# Patient Record
Sex: Male | Born: 1990 | Hispanic: Yes | Marital: Married | Smoking: Current some day smoker
Health system: Southern US, Community
[De-identification: ages and names within clinical notes are randomized; demographics above are authoritative.]

---

## 2020-08-03 ENCOUNTER — Other Ambulatory Visit: Payer: Self-pay | Admitting: Obstetrics and Gynecology

## 2020-08-03 ENCOUNTER — Ambulatory Visit
Admission: RE | Admit: 2020-08-03 | Discharge: 2020-08-03 | Disposition: A | Discharge status: Home / Self Care | Payer: No Typology Code available for payment source | Source: Ambulatory Visit | Attending: Obstetrics and Gynecology | Admitting: Obstetrics and Gynecology

## 2020-08-03 DIAGNOSIS — R7612 Nonspecific reaction to cell mediated immunity measurement of gamma interferon antigen response without active tuberculosis: Secondary | ICD-10-CM

## 2020-11-03 ENCOUNTER — Other Ambulatory Visit: Payer: Self-pay

## 2020-11-03 ENCOUNTER — Encounter (HOSPITAL_COMMUNITY): Payer: Self-pay

## 2020-11-03 ENCOUNTER — Ambulatory Visit (HOSPITAL_COMMUNITY)
Admission: RE | Admit: 2020-11-03 | Discharge: 2020-11-03 | Disposition: A | Discharge status: Home / Self Care | Payer: Medicaid Other | Source: Ambulatory Visit

## 2020-11-03 VITALS — BP 121/86 | HR 70 | Temp 98.4°F | Resp 16

## 2020-11-03 DIAGNOSIS — R519 Headache, unspecified: Secondary | ICD-10-CM | POA: Diagnosis not present

## 2020-11-03 NOTE — ED Provider Notes (Signed)
[facility]  ____________________________________________  Time seen: Approximately 4:54 PM  I have reviewed the triage vital signs and the nursing notes.   HISTORY  Chief Complaint appt 1500   Historian Patient     HPI George Cannon is a 30 y.o. male using a Nurse, learning disability, presents to the urgent care with concern for occipital headache that has occurred for the past 4 days after patient fell from standing height and hit concrete.  Patient states that he did experience loss of consciousness.  He denies vomiting but states that he has had some nausea.  Patient would like a CT scan of his head to rule out intracranial bleed or skull fracture.  He has been taking Tylenol at home with little relief.  He denies neck pain.  No numbness or tingling in the upper and lower extremities.   History reviewed. No pertinent past medical history.   Immunizations up to date:  Yes.     History reviewed. No pertinent past medical history.  There are no problems to display for this patient.   History reviewed. No pertinent surgical history.  Prior to Admission medications   Not on File    Allergies Patient has no known allergies.  No family history on file.  Social History     Review of Systems  Constitutional: No fever/chills Eyes:  No discharge ENT: No upper respiratory complaints. Respiratory: no cough. No SOB/ use of accessory muscles to breath Gastrointestinal:   No nausea, no vomiting.  No diarrhea.  No constipation. Musculoskeletal: Negative for musculoskeletal pain. Neuro: Patient has headache.  Skin: Negative for rash, abrasions, lacerations, ecchymosis.    ____________________________________________   PHYSICAL EXAM:  VITAL SIGNS: ED Triage Vitals  Enc Vitals Group     BP 11/03/20 1618 121/86     Pulse Rate 11/03/20 1618 70     Resp 11/03/20 1618 16     Temp 11/03/20 1618 98.4 F (36.9 C)     Temp Source 11/03/20 1618 Oral     SpO2  11/03/20 1618 97 %     Weight --      Height --      Head Circumference --      Peak Flow --      Pain Score 11/03/20 1652 0     Pain Loc --      Pain Edu? --      Excl. in GC? --      Constitutional: Alert and oriented. Well appearing and in no acute distress. Eyes: Conjunctivae are normal. PERRL. EOMI. Head: Atraumatic. ENT:      Nose: No congestion/rhinnorhea.      Mouth/Throat: Mucous membranes are moist.  Neck: No stridor.  No cervical spine tenderness to palpation. Cardiovascular: Normal rate, regular rhythm. Normal S1 and S2.  Good peripheral circulation. Respiratory: Normal respiratory effort without tachypnea or retractions. Lungs CTAB. Good air entry to the bases with no decreased or absent breath sounds Gastrointestinal: Bowel sounds x 4 quadrants. Soft and nontender to palpation. No guarding or rigidity. No distention. Musculoskeletal: Full range of motion to all extremities. No obvious deformities noted Neurologic:  Normal for age. No gross focal neurologic deficits are appreciated.  Skin:  Skin is warm, dry and intact. No rash noted. Psychiatric: Mood and affect are normal for age. Speech and behavior are normal.   ____________________________________________   LABS (all labs ordered are listed, but only abnormal results are displayed)  Labs Reviewed - No data to display ____________________________________________  EKG  ____________________________________________  RADIOLOGY   No results found.  ____________________________________________    PROCEDURES  Procedure(s) performed:     Procedures     Medications - No data to display   ____________________________________________   INITIAL IMPRESSION / ASSESSMENT AND PLAN / ED COURSE  Pertinent labs & imaging results that were available during my care of the patient were reviewed by me and considered in my medical decision making (see chart for details).      Assessment and  Plan: Headache:  30 y/o male presents to the UC with occipital headache for the past four days.  Vital signs were reassuring at triage without neuro symptoms on exam.   Patient would like head CT to rule out intracranial bleed and skull fracture. Patient was referred to the ED for further care and management due to limited diagnostic capacity here in the urgent care setting.     ____________________________________________  FINAL CLINICAL IMPRESSION(S) / ED DIAGNOSES  Final diagnoses:  Nonintractable headache, unspecified chronicity pattern, unspecified headache type      NEW MEDICATIONS STARTED DURING THIS VISIT:  ED Discharge Orders     None           This chart was dictated using voice recognition software/Dragon. Despite best efforts to proofread, errors can occur which can change the meaning. Any change was purely unintentional.     Orvil Feil, PA-C 11/03/20 1657

## 2020-11-03 NOTE — Discharge Instructions (Addendum)
Please go to Emergency Department at [facility]  Please go to emergency department for CT scan of your head. You more than likely have a concussion. However, we do not have access to CT imaging here in the urgent care and cannot rule out intracranial bleed or skull fracture given your recent occipital head trauma with loss of consciousness.

## 2020-11-03 NOTE — ED Triage Notes (Signed)
Pt was at work and felt dizzy with headache and fell down and hit head on concrete. Got patient to clarify that he fainted. Reports this happened 3 days ago. Concerned that is related to his blood pressure. Denies having any hx of blood pressure issues.  Reports only takes pain killers-tylenol extra strength for headache. Reports has been having headaches before his fall due to not sleeping well. But since syncopal episode has had worsening headaches.

## 2020-11-04 ENCOUNTER — Encounter (HOSPITAL_BASED_OUTPATIENT_CLINIC_OR_DEPARTMENT_OTHER): Payer: Self-pay

## 2020-11-04 ENCOUNTER — Emergency Department (HOSPITAL_BASED_OUTPATIENT_CLINIC_OR_DEPARTMENT_OTHER)
Admission: EM | Admit: 2020-11-04 | Discharge: 2020-11-04 | Disposition: A | Discharge status: Home / Self Care | Payer: Medicaid Other | Attending: Emergency Medicine | Admitting: Emergency Medicine

## 2020-11-04 ENCOUNTER — Other Ambulatory Visit: Payer: Self-pay

## 2020-11-04 ENCOUNTER — Emergency Department (HOSPITAL_BASED_OUTPATIENT_CLINIC_OR_DEPARTMENT_OTHER): Payer: Medicaid Other

## 2020-11-04 DIAGNOSIS — W19XXXA Unspecified fall, initial encounter: Secondary | ICD-10-CM | POA: Diagnosis not present

## 2020-11-04 DIAGNOSIS — S060X9A Concussion with loss of consciousness of unspecified duration, initial encounter: Secondary | ICD-10-CM | POA: Insufficient documentation

## 2020-11-04 DIAGNOSIS — R519 Headache, unspecified: Secondary | ICD-10-CM | POA: Diagnosis present

## 2020-11-04 DIAGNOSIS — S060XAA Concussion with loss of consciousness status unknown, initial encounter: Secondary | ICD-10-CM

## 2020-11-04 LAB — CBC WITH DIFFERENTIAL/PLATELET
Abs Immature Granulocytes: 0.01 10*3/uL (ref 0.00–0.07)
Basophils Absolute: 0 10*3/uL (ref 0.0–0.1)
Basophils Relative: 0 %
Eosinophils Absolute: 0.1 10*3/uL (ref 0.0–0.5)
Eosinophils Relative: 2 %
HCT: 42.5 % (ref 39.0–52.0)
Hemoglobin: 14.9 g/dL (ref 13.0–17.0)
Immature Granulocytes: 0 %
Lymphocytes Relative: 41 %
Lymphs Abs: 2.8 10*3/uL (ref 0.7–4.0)
MCH: 29.7 pg (ref 26.0–34.0)
MCHC: 35.1 g/dL (ref 30.0–36.0)
MCV: 84.8 fL (ref 80.0–100.0)
Monocytes Absolute: 0.7 10*3/uL (ref 0.1–1.0)
Monocytes Relative: 9 %
Neutro Abs: 3.3 10*3/uL (ref 1.7–7.7)
Neutrophils Relative %: 48 %
Platelets: 241 10*3/uL (ref 150–400)
RBC: 5.01 MIL/uL (ref 4.22–5.81)
RDW: 12.8 % (ref 11.5–15.5)
WBC: 6.9 10*3/uL (ref 4.0–10.5)
nRBC: 0 % (ref 0.0–0.2)

## 2020-11-04 LAB — BASIC METABOLIC PANEL
Anion gap: 6 (ref 5–15)
BUN: 14 mg/dL (ref 6–20)
CO2: 32 mmol/L (ref 22–32)
Calcium: 9.5 mg/dL (ref 8.9–10.3)
Chloride: 102 mmol/L (ref 98–111)
Creatinine, Ser: 0.74 mg/dL (ref 0.61–1.24)
GFR, Estimated: >60 mL/min (ref >60)
Glucose, Bld: 77 mg/dL (ref 70–99)
Potassium: 4 mmol/L (ref 3.5–5.1)
Sodium: 140 mmol/L (ref 135–145)

## 2020-11-04 MED ORDER — IBUPROFEN 400 MG PO TABS
600.0000 mg | ORAL_TABLET | Freq: Once | ORAL | Status: AC
  Administered 2020-11-04: 600 mg via ORAL
  Filled 2020-11-04: qty 1

## 2020-11-04 NOTE — ED Notes (Signed)
ED Provider at bedside. 

## 2020-11-04 NOTE — ED Triage Notes (Signed)
Interpreter [phone] George Cannon  Pt reports he fell 3-4 days ago, unable to sleep since the fall. Pt reports dizziness when he fell d/t being tired at work. States he can only sleep 2 hours before waking with a headache

## 2020-11-04 NOTE — Discharge Instructions (Addendum)
Take ibuprofen and Tylenol for your headache.  If you develop new or worsening headache, vomiting, vision changes, weakness or numbness, speech difficulty, or any other new/concerning symptoms then return to the ER for evaluation.

## 2020-11-04 NOTE — ED Provider Notes (Signed)
[facility] EMERGENCY DEPT Provider Note   CSN: [id number] Arrival date & time: 11/04/20  3557     History Chief Complaint  Patient presents with   Fall    George Cannon is a 30 y.o. male.  HPI 30 year old male presents with headache.  The Pashto video interpreter was used.  He has been dealing with a headache for 3 to 4 days since passing out and hitting the back of his head.  He was at work and felt progressively lightheaded with darkening vision for a few minutes and then fell backwards.  He states that he has had a headache ever since he woke up.  No headache prior.  No vomiting, vision changes, numbness.  He is having a little bit of neck pain at the same area as well.  Some pain with laying flat and movement of his head/neck.  He went to urgent care where they told him to get a CT scan.  He has been trying some Tylenol a couple times without relief. Headache is rated as 8/10.  History reviewed. No pertinent past medical history.  There are no problems to display for this patient.   History reviewed. No pertinent surgical history.     No family history on file.  Social History   Tobacco Use   Smoking status: Never   Smokeless tobacco: Never  Vaping Use   Vaping Use: Never used  Substance Use Topics   Alcohol use: Yes    Comment: social   Drug use: Yes    Types: Marijuana    Home Medications Prior to Admission medications   Not on File    Allergies    Patient has no known allergies.  Review of Systems   Review of Systems  Eyes:  Negative for visual disturbance.  Gastrointestinal:  Negative for vomiting.  Musculoskeletal:  Positive for neck pain.  Neurological:  Positive for syncope and headaches. Negative for weakness and numbness.  All other systems reviewed and are negative.  Physical Exam Updated Vital Signs BP 120/87   Pulse 60   Temp 98.4 F (36.9 C) (Oral)   Resp 15   SpO2 100%   Physical Exam Vitals and nursing note  reviewed.  Constitutional:      Appearance: He is well-developed.  HENT:     Head: Normocephalic and atraumatic.      Right Ear: External ear normal.     Left Ear: External ear normal.     Nose: Nose normal.  Eyes:     General:        Right eye: No discharge.        Left eye: No discharge.     Extraocular Movements: Extraocular movements intact.     Pupils: Pupils are equal, round, and reactive to light.  Cardiovascular:     Rate and Rhythm: Normal rate and regular rhythm.     Heart sounds: Normal heart sounds. No murmur heard. Pulmonary:     Effort: Pulmonary effort is normal.     Breath sounds: Normal breath sounds.  Abdominal:     Palpations: Abdomen is soft.     Tenderness: There is no abdominal tenderness.  Musculoskeletal:     Cervical back: Neck supple.  Skin:    General: Skin is warm and dry.  Neurological:     Mental Status: He is alert.     Comments: CN 3-12 grossly intact. 5/5 strength in all 4 extremities. Grossly normal sensation. Normal finger to nose.  Psychiatric:        Mood and Affect: Mood is not anxious.    ED Results / Procedures / Treatments   Labs (all labs ordered are listed, but only abnormal results are displayed) Labs Reviewed  BASIC METABOLIC PANEL  CBC WITH DIFFERENTIAL/PLATELET    EKG EKG Interpretation  Date/Time:  Wednesday November 04 2020 10:45:39 EDT Ventricular Rate:  61 PR Interval:  126 QRS Duration: 92 QT Interval:  400 QTC Calculation: 402 R Axis:   82 Text Interpretation: Normal sinus rhythm no acute ST/T changes No old tracing to compare Confirmed by Pricilla Loveless [id number]) on 11/04/2020 10:59:44 AM  Radiology CT Head Wo Contrast  Result Date: 11/04/2020 CLINICAL DATA:  Fall, head trauma 3-4 days ago EXAM: CT HEAD WITHOUT CONTRAST TECHNIQUE: Contiguous axial images were obtained from the base of the skull through the vertex without intravenous contrast. COMPARISON:  None. FINDINGS: Brain: No evidence of acute  infarction, hemorrhage, hydrocephalus, extra-axial collection or mass lesion/mass effect. Vascular: No hyperdense vessel or unexpected calcification. Skull: Normal. Negative for fracture or focal lesion. Sinuses/Orbits: No acute finding. Other: None. IMPRESSION: No acute intracranial abnormality Electronically Signed   By: Larose Hires D.O.   On: 11/04/2020 13:07   CT Cervical Spine Wo Contrast  Result Date: 11/04/2020 CLINICAL DATA:  Fall, trauma 3-4 days ago.  Neck pain EXAM: CT CERVICAL SPINE WITHOUT CONTRAST TECHNIQUE: Multidetector CT imaging of the cervical spine was performed without intravenous contrast. Multiplanar CT image reconstructions were also generated. COMPARISON:  None. FINDINGS: Alignment: Normal. Skull base and vertebrae: No acute fracture. No primary bone lesion or focal pathologic process. Soft tissues and spinal canal: No prevertebral fluid or swelling. No visible canal hematoma. Disc levels: Disc spaces are maintained. No appreciate spinal canal or neural foraminal narrowing. Upper chest: Negative. Other: None. IMPRESSION: No evidence of acute fracture or subluxation. No significant soft tissue abnormality. Electronically Signed   By: Larose Hires D.O.   On: 11/04/2020 13:13    Procedures Procedures   Medications Ordered in ED Medications  ibuprofen (ADVIL) tablet 600 mg (600 mg Oral Given 11/04/20 1154)    ED Course  I have reviewed the triage vital signs and the nursing notes.  Pertinent labs & imaging results that were available during my care of the patient were reviewed by me and considered in my medical decision making (see chart for details).    MDM Rules/Calculators/A&P                           Labs and CT results reviewed.  ECG is unremarkable.  Unclear why he had syncope several days ago.  However I have lower suspicion for serious pathology given no significant medical history, benign lab work and benign ECG.  No murmur.  Otherwise, it sound like his  headache is from a concussion and local trauma.  Discussed through the interpreter that this will take time to heal and to use ibuprofen/Tylenol.  We will give work note.  Otherwise, he appears stable for discharge home with follow-up with concussion specialist. Final Clinical Impression(s) / ED Diagnoses Final diagnoses:  Concussion with unknown loss of consciousness status, initial encounter    Rx / DC Orders ED Discharge Orders     None        Pricilla Loveless, MD 11/04/20 1525

## 2021-05-07 ENCOUNTER — Encounter: Payer: Self-pay | Admitting: Physician Assistant

## 2021-05-07 ENCOUNTER — Ambulatory Visit (INDEPENDENT_AMBULATORY_CARE_PROVIDER_SITE_OTHER): Payer: BC Managed Care – PPO | Admitting: Physician Assistant

## 2021-05-07 VITALS — BP 120/83 | HR 72 | Temp 97.2°F | Ht 68.5 in | Wt 148.0 lb

## 2021-05-07 DIAGNOSIS — Z0000 Encounter for general adult medical examination without abnormal findings: Secondary | ICD-10-CM

## 2021-05-07 DIAGNOSIS — Z789 Other specified health status: Secondary | ICD-10-CM | POA: Diagnosis not present

## 2021-05-07 DIAGNOSIS — Z13220 Encounter for screening for lipoid disorders: Secondary | ICD-10-CM

## 2021-05-07 LAB — COMPREHENSIVE METABOLIC PANEL
ALT: 18 U/L (ref 0–53)
AST: 23 U/L (ref 0–37)
Albumin: 4.9 g/dL (ref 3.5–5.2)
Alkaline Phosphatase: 83 U/L (ref 39–117)
BUN: 16 mg/dL (ref 6–23)
CO2: 32 mEq/L (ref 19–32)
Calcium: 9.7 mg/dL (ref 8.4–10.5)
Chloride: 99 mEq/L (ref 96–112)
Creatinine, Ser: 0.82 mg/dL (ref 0.40–1.50)
GFR: 118.86 mL/min (ref >60.00)
Glucose, Bld: 87 mg/dL (ref 70–99)
Potassium: 4 mEq/L (ref 3.5–5.1)
Sodium: 138 mEq/L (ref 135–145)
Total Bilirubin: 0.8 mg/dL (ref 0.2–1.2)
Total Protein: 8.1 g/dL (ref 6.0–8.3)

## 2021-05-07 LAB — LIPID PANEL
Cholesterol: 144 mg/dL (ref 0–200)
HDL: 38.5 mg/dL — ABNORMAL LOW (ref >39.00)
LDL Cholesterol: 81 mg/dL (ref 0–99)
NonHDL: 105.21
Total CHOL/HDL Ratio: 4
Triglycerides: 123 mg/dL (ref 0.0–149.0)
VLDL: 24.6 mg/dL (ref 0.0–40.0)

## 2021-05-07 LAB — CBC WITH DIFFERENTIAL/PLATELET
Basophils Absolute: 0 10*3/uL (ref 0.0–0.1)
Basophils Relative: 0.3 % (ref 0.0–3.0)
Eosinophils Absolute: 0.1 10*3/uL (ref 0.0–0.7)
Eosinophils Relative: 1.3 % (ref 0.0–5.0)
HCT: 42.8 % (ref 39.0–52.0)
Hemoglobin: 14.9 g/dL (ref 13.0–17.0)
Lymphocytes Relative: 37.7 % (ref 12.0–46.0)
Lymphs Abs: 3.4 10*3/uL (ref 0.7–4.0)
MCHC: 34.8 g/dL (ref 30.0–36.0)
MCV: 87.3 fl (ref 78.0–100.0)
Monocytes Absolute: 0.6 10*3/uL (ref 0.1–1.0)
Monocytes Relative: 7 % (ref 3.0–12.0)
Neutro Abs: 4.8 10*3/uL (ref 1.4–7.7)
Neutrophils Relative %: 53.7 % (ref 43.0–77.0)
Platelets: 267 10*3/uL (ref 150.0–400.0)
RBC: 4.9 Mil/uL (ref 4.22–5.81)
RDW: 12.1 % (ref 11.5–15.5)
WBC: 8.9 10*3/uL (ref 4.0–10.5)

## 2021-05-07 NOTE — Patient Instructions (Signed)
Good to meet you today! ?Please go to the lab for blood work and I will send results through MyChart. ?Consider quitting e-cigarette use. ?Work on lifestyle - get outside, walk, hike, enjoy the scenery in [place]. ? ?Call if any concerns.  ?

## 2021-05-07 NOTE — Progress Notes (Signed)
? ?Subjective:  ? ? Patient ID: French Dil Luci Bank, male    DOB: [birth year], 31 y.o.   MRN: [id number] ? ?Chief Complaint  ?Patient presents with  ? Establish Care  ? ?A Pashto interpreter was used for the duration of today's visit. ? ?HPI ?31 y.o. patient presents today for new patient establishment with me.  Patient has not had primary care in a long time. Last time he saw a doctor was in October 2022 after falling at work and suffering a concussion. Works during the night, sleeps during the day. Family in [place]. Lives here with friends. Moved in 2020.  ? ?Current Care Team: ?None   ? ?Acute concerns: ?No health concerns ? 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 ? ? ?History reviewed. No pertinent past medical history. ? ?History reviewed. No pertinent surgical history. ? ?History reviewed. No pertinent family history. ? ?Social History  ? ?Tobacco Use  ? Smoking status: Every Day  ?  Types: E-cigarettes  ? Smokeless tobacco: Never  ?Vaping Use  ? Vaping Use: Never used  ?Substance Use Topics  ? Alcohol use: Not Currently  ?  Comment: social  ? Drug use: Yes  ?  Types: Marijuana  ?  ? ?No Known Allergies ? ?Review of Systems ?NEGATIVE UNLESS OTHERWISE INDICATED IN HPI ? ? ?   ?Objective:  ?  ? ?Temp (!) 97.2 ?F (36.2 ?C) (Temporal)   Ht 5' 8.5" (1.74 m)   Wt 148 lb (67.1 kg)   BMI 22.17 kg/m?  ? ?Wt Readings from Last 3 Encounters:  ?05/07/21 148 lb (67.1 kg)  ? ? ?BP Readings from Last 3 Encounters:  ?11/04/20 120/87  ?11/03/20 121/86  ?  ? ?Physical Exam ?Vitals and nursing note  reviewed.  ?Constitutional:   ?   General: He is not in acute distress. ?   Appearance: Normal appearance. He is not toxic-appearing.  ?HENT:  ?   Head: Normocephalic and atraumatic.  ?   Right Ear: Tympanic membrane, ear canal and external ear normal.  ?   Left Ear: Tympanic membrane, ear canal and external ear normal.  ?   Nose: Nose normal.  ?   Mouth/Throat:  ?   Mouth: Mucous membranes are moist.  ?   Pharynx: Oropharynx is clear.  ?Eyes:  ?   Extraocular Movements: Extraocular movements intact.  ?   Conjunctiva/sclera: Conjunctivae normal.  ?   Pupils: Pupils are equal, round, and reactive to light.  ?Cardiovascular:  ?   Rate and Rhythm: Normal rate and regular rhythm.  ?   Pulses: Normal pulses.  ?   Heart sounds: Normal heart sounds.  ?Pulmonary:  ?   Effort: Pulmonary effort is normal.  ?   Breath sounds: Normal breath sounds.  ?Abdominal:  ?   General: Abdomen is flat. Bowel sounds are normal.  ?   Palpations: Abdomen is soft.  ?   Tenderness: There is no abdominal tenderness.  ?Musculoskeletal:     ?   General: Normal range of motion.  ?   Cervical back: Normal range of motion and neck supple.  ?Skin: ?  General: Skin is warm and dry.  ?Neurological:  ?   General: No focal deficit present.  ?   Mental Status: He is alert and oriented to person, place, and time.  ?Psychiatric:     ?   Mood and Affect: Mood normal.     ?   Behavior: Behavior normal.  ? ? ?   ?Assessment & Plan:  ? ?Problem List Items Addressed This Visit   ?None ?Visit Diagnoses   ? ? Encounter for annual physical exam    -  Primary  ? Relevant Orders  ? CBC with Differential/Platelet  ? Comprehensive metabolic panel  ? Lipid panel  ? Electronic cigarette use      ? Relevant Orders  ? CBC with Differential/Platelet  ? Comprehensive metabolic panel  ? Screening for cholesterol level      ? Relevant Orders  ? Lipid panel  ? ?  ? ?Plan: ?Age-appropriate screening and counseling performed today. Will check labs and call with results.  Preventive measures discussed and printed in AVS for patient.  ?Encouraged to quit e-cigarettes. ?Works a lot, encouraged healthy lifestyle and hobbies. ?No other concerns today ? ?F/up 1 year or prn.  ? ? ?Dequavion Follette M Keston Seever, PA-C ?

## 2021-06-28 ENCOUNTER — Ambulatory Visit: Payer: BC Managed Care – PPO | Admitting: Physician Assistant

## 2021-06-29 ENCOUNTER — Ambulatory Visit: Payer: BC Managed Care – PPO | Admitting: Physician Assistant

## 2021-06-29 VITALS — BP 124/80 | HR 65 | Temp 98.2°F | Ht 68.5 in | Wt 150.2 lb

## 2021-06-29 DIAGNOSIS — Z789 Other specified health status: Secondary | ICD-10-CM | POA: Diagnosis not present

## 2021-06-29 DIAGNOSIS — K219 Gastro-esophageal reflux disease without esophagitis: Secondary | ICD-10-CM

## 2021-06-29 DIAGNOSIS — R0781 Pleurodynia: Secondary | ICD-10-CM

## 2021-06-29 DIAGNOSIS — R1013 Epigastric pain: Secondary | ICD-10-CM | POA: Diagnosis not present

## 2021-06-29 LAB — COMPREHENSIVE METABOLIC PANEL
ALT: 14 U/L (ref 0–53)
AST: 18 U/L (ref 0–37)
Albumin: 4.4 g/dL (ref 3.5–5.2)
Alkaline Phosphatase: 75 U/L (ref 39–117)
BUN: 16 mg/dL (ref 6–23)
CO2: 32 mEq/L (ref 19–32)
Calcium: 9.2 mg/dL (ref 8.4–10.5)
Chloride: 101 mEq/L (ref 96–112)
Creatinine, Ser: 0.87 mg/dL (ref 0.40–1.50)
GFR: 116.64 mL/min (ref >60.00)
Glucose, Bld: 83 mg/dL (ref 70–99)
Potassium: 3.7 mEq/L (ref 3.5–5.1)
Sodium: 138 mEq/L (ref 135–145)
Total Bilirubin: 0.7 mg/dL (ref 0.2–1.2)
Total Protein: 7.5 g/dL (ref 6.0–8.3)

## 2021-06-29 LAB — CBC WITH DIFFERENTIAL/PLATELET
Basophils Absolute: 0 10*3/uL (ref 0.0–0.1)
Basophils Relative: 0.2 % (ref 0.0–3.0)
Eosinophils Absolute: 0.2 10*3/uL (ref 0.0–0.7)
Eosinophils Relative: 2.1 % (ref 0.0–5.0)
HCT: 43.9 % (ref 39.0–52.0)
Hemoglobin: 15.2 g/dL (ref 13.0–17.0)
Lymphocytes Relative: 41.2 % (ref 12.0–46.0)
Lymphs Abs: 3.1 10*3/uL (ref 0.7–4.0)
MCHC: 34.7 g/dL (ref 30.0–36.0)
MCV: 87.9 fl (ref 78.0–100.0)
Monocytes Absolute: 0.5 10*3/uL (ref 0.1–1.0)
Monocytes Relative: 6.6 % (ref 3.0–12.0)
Neutro Abs: 3.7 10*3/uL (ref 1.4–7.7)
Neutrophils Relative %: 49.9 % (ref 43.0–77.0)
Platelets: 248 10*3/uL (ref 150.0–400.0)
RBC: 4.99 Mil/uL (ref 4.22–5.81)
RDW: 12.8 % (ref 11.5–15.5)
WBC: 7.5 10*3/uL (ref 4.0–10.5)

## 2021-06-29 LAB — LIPASE: Lipase: 28 U/L (ref 11.0–59.0)

## 2021-06-29 MED ORDER — PANTOPRAZOLE SODIUM 40 MG PO TBEC: 40.0000 mg | DELAYED_RELEASE_TABLET | Freq: Every day | ORAL | 0 refills | Status: DC

## 2021-06-29 NOTE — Patient Instructions (Signed)
Thanks for coming in today!  Plan to check labs - treat pending results. Rule out Ova and parasites per stool.  Start on Protonix 40 mg once daily 30 minutes prior to first meal of day.   Consider Ultrasound abdomen.  ED if acute severe sudden worsening pain.

## 2021-06-29 NOTE — Addendum Note (Signed)
Addended by: Tharon Aquas on: 06/29/2021 09:56 AM   Modules accepted: Orders

## 2021-06-29 NOTE — Progress Notes (Signed)
Subjective:    Patient ID: George Cannon, male    DOB: December 14, 1990, 31 y.o.   MRN: [id number]  Chief Complaint  Patient presents with   Abdominal Pain    Pt c/o Upper abdominal pain and pt states having ULQ and URQ pain in abdomen as well; pt says its been going on for years but has got worse over past few days. Pt really not understanding any questions even with interpreter; sponsor came in the room to help patient with information needed to assist provider with examination    Due to language barrier, an interpreter was present during the history-taking and subsequent discussion (and for part of the physical exam) with this patient.   Abdominal Pain   Patient is a 31 yo Afghani male in today for upper abdominal pain and sore lower ribs. Worse in the last 2 days, seems somewhat better now. Able to eat. He has had this pain come and go over the last few years. Still has some pain today. Does not seem to be assoc with any foods. 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   No abnormal weight loss. No night sweats. No fevers. No stool changes. NKI.   Notes some burning/ acid feeling. Has been under a lot of stress. Irregular eating patterns.   Host with him today says that some colleagues of his were treated with Ivermectin for a type of parasite in the stomach, not sure if he was treated or not.   No past medical history on file.  No past surgical history on file.  No family history on file.  Social History   Tobacco Use   Smoking status: Every Day    Types: E-cigarettes   Smokeless tobacco: Never  Vaping Use   Vaping Use: Never used  Substance Use Topics   Alcohol use: Not Currently    Comment: social   Drug use: Yes    Types: Marijuana     No Known Allergies  Review of Systems  Gastrointestinal:  Positive for abdominal pain.   NEGATIVE UNLESS OTHERWISE INDICATED IN HPI      Objective:     BP 124/80 (BP  Location: Right Arm)   Pulse 65   Temp 98.2 F (36.8 C) (Temporal)   Ht 5' 8.5" (1.74 m)   Wt 150 lb 3.2 oz (68.1 kg)   SpO2 97%   BMI 22.51 kg/m   Wt Readings from Last 3 Encounters:  06/29/21 150 lb 3.2 oz (68.1 kg)  05/07/21 148 lb (67.1 kg)    BP Readings from Last 3 Encounters:  06/29/21 124/80  05/07/21 120/83  11/04/20 120/87     Physical Exam Vitals and nursing note reviewed.  Constitutional:      General: He is not in acute distress.    Appearance: Normal appearance. He is not toxic-appearing.  HENT:     Head: Normocephalic and atraumatic.     Right Ear: Tympanic membrane, ear canal and external ear normal.     Left Ear: Tympanic membrane, ear canal and external ear normal.     Nose: Nose normal.     Mouth/Throat:     Mouth: Mucous membranes are moist.     Pharynx: Oropharynx is clear.  Eyes:     Extraocular Movements: Extraocular movements intact.     Conjunctiva/sclera: Conjunctivae normal.     Pupils: Pupils are equal, round, and reactive to light.  Cardiovascular:  Rate and Rhythm: Normal rate and regular rhythm.     Pulses: Normal pulses.     Heart sounds: Normal heart sounds.  Pulmonary:     Effort: Pulmonary effort is normal.     Breath sounds: Normal breath sounds.  Chest:     Comments: Some TTP along bilateral lower ribs  Abdominal:     General: Abdomen is flat. Bowel sounds are normal.     Palpations: Abdomen is soft.     Tenderness: There is abdominal tenderness in the right upper quadrant, epigastric area and left upper quadrant. There is no right CVA tenderness, left CVA tenderness, guarding or rebound. Negative signs include Murphy's sign and McBurney's sign.  Musculoskeletal:        General: Normal range of motion.     Cervical back: Normal range of motion and neck supple.  Skin:    General: Skin is warm and dry.  Neurological:     General: No focal deficit present.     Mental Status: He is alert and oriented to person, place, and  time.  Psychiatric:        Mood and Affect: Mood normal.        Behavior: Behavior normal.        Assessment & Plan:   Problem List Items Addressed This Visit   None Visit Diagnoses     Epigastric pain    -  Primary   Relevant Orders   CBC with Differential/Platelet   Comprehensive metabolic panel   Lipase   H. pylori breath test   Ova and parasite examination   Gastroesophageal reflux disease without esophagitis       Relevant Medications   pantoprazole (PROTONIX) 40 MG tablet   Rib tenderness       History of foreign travel       Relevant Orders   Ova and parasite examination        Meds ordered this encounter  Medications   pantoprazole (PROTONIX) 40 MG tablet    Sig: Take 1 tablet (40 mg total) by mouth daily before breakfast.    Dispense:  30 tablet    Refill:  0    Order Specific Question:   Supervising Provider    Answer:   Shelva Majestic [[id number]]   PLAN: Check labs, treat pending results Treat for possible GERD with protonix as directed Consider abd [place] Need to r/o ova and parasites per history / colleagues with this Some soreness to ribs, probably MSK in nature due to work, treat with heat / ibuprofen prn ED precautions advised   This note was prepared with assistance of Conservation officer, historic buildings. Occasional wrong-word or sound-a-like substitutions may have occurred due to the inherent limitations of voice recognition software.    Drexel Ivey M Dhruti Ghuman, PA-C

## 2021-07-01 LAB — H. PYLORI BREATH TEST: H. pylori Breath Test: NOT DETECTED

## 2021-07-06 NOTE — Progress Notes (Signed)
Results reviewed by pt via Mychart. Seen by patient George Cannon on 07/03/2021  7:29 AM

## 2021-10-04 ENCOUNTER — Encounter: Payer: Self-pay | Admitting: *Deleted

## 2021-12-23 ENCOUNTER — Encounter: Payer: Self-pay | Admitting: *Deleted

## 2022-06-02 ENCOUNTER — Encounter: Payer: Self-pay | Admitting: Gastroenterology

## 2022-06-02 ENCOUNTER — Ambulatory Visit: Payer: 59 | Admitting: Gastroenterology

## 2022-06-02 VITALS — BP 114/80 | HR 53 | Ht 68.5 in | Wt 132.0 lb

## 2022-06-02 DIAGNOSIS — R1013 Epigastric pain: Secondary | ICD-10-CM

## 2022-06-02 DIAGNOSIS — K219 Gastro-esophageal reflux disease without esophagitis: Secondary | ICD-10-CM

## 2022-06-02 MED ORDER — PANTOPRAZOLE SODIUM 40 MG PO TBEC: 40.0000 mg | DELAYED_RELEASE_TABLET | Freq: Every day | ORAL | 3 refills | Status: DC

## 2022-06-02 NOTE — Progress Notes (Signed)
HPI:  George Cannon is a 32 y.o. male who is referred to [place] by Allwardt, Crist Infante, PA-C for further evaluation of chronic abdominal pain.  Abdominal Pain This is a chronic problem. The current episode started more than 1 month ago (patient states it started a few months ago, but patient was seen for this problem a year ago). The onset quality is undetermined. The problem occurs intermittently (patient unable to give estimation of frequency). The problem has been unchanged. The pain is located in the epigastric region. The pain is moderate. The quality of the pain is dull and aching. The abdominal pain does not radiate. Associated symptoms include anorexia, belching and weight loss. Pertinent negatives include no constipation, diarrhea, fever, hematochezia, melena, nausea or vomiting. Associated symptoms comments: 20lbs weight loss over the past year. The pain is aggravated by eating (cites milk and orange juice specifically). He has tried proton pump inhibitors (prescribed Protonix last year, which helped but symptoms returned when he ran out) for the symptoms. The treatment provided significant relief. Prior workup: negative H. pylori breath test. There is no history of abdominal surgery.   He reports being under considerable stress given his prolonged separation from his wife and children who are still in [place].  He thinks this may be contributing to his symptoms. He denies heartburn, but does admit to recurrent acid regurgitation and waterbrash. He reports having regular bowel movements and denies problems with diarrhea, constipation or blood in stool.  Component Ref Range & Units 11 mo ago  H. pylori Breath Test NOT DETECTED NOT DETECTED   History reviewed. No pertinent past medical history.   History reviewed. No pertinent surgical history. Family History  Problem Relation Age of Onset   Hypertension Father    Colon cancer Neg Hx    Esophageal cancer Neg Hx    Stomach  cancer Neg Hx    Social History   Tobacco Use   Smoking status: Every Day    Types: E-cigarettes   Smokeless tobacco: Never  Vaping Use   Vaping Use: Never used  Substance Use Topics   Alcohol use: Not Currently    Comment: social   Drug use: Yes    Types: Marijuana   Current Outpatient Medications  Medication Sig Dispense Refill   acetaminophen (TYLENOL) 500 MG tablet Take 500 mg by mouth every 6 (six) hours as needed.     pantoprazole (PROTONIX) 40 MG tablet Take 1 tablet (40 mg total) by mouth daily before breakfast. 30 tablet 0   No current facility-administered medications for this visit.   No Known Allergies   Review of Systems  Constitutional:  Positive for weight loss. Negative for chills and fever.  Gastrointestinal:  Positive for abdominal pain and anorexia. Negative for blood in stool, constipation, diarrhea, heartburn, hematochezia, melena, nausea and vomiting.      No results found.  BP 114/80   Pulse (!) 53   Ht 5' 8.5" (1.74 m)   Wt 132 lb (59.9 kg)   BMI 19.78 kg/m  Physical Exam Vitals and nursing note (He is accompanied by a medical translator today) reviewed.  Constitutional:      General: He is not in acute distress.    Appearance: Normal appearance. He is normal weight. He is not ill-appearing.  HENT:     Head: Normocephalic and atraumatic.     Mouth/Throat:     Mouth: Mucous membranes are moist.     Pharynx: Oropharynx is clear. No oropharyngeal exudate  or posterior oropharyngeal erythema.  Eyes:     General: No scleral icterus.    Conjunctiva/sclera: Conjunctivae normal.  Cardiovascular:     Rate and Rhythm: Normal rate and regular rhythm.     Heart sounds: Normal heart sounds. No murmur heard. Pulmonary:     Effort: Pulmonary effort is normal.     Breath sounds: Normal breath sounds. No wheezing, rhonchi or rales.  Abdominal:     General: Abdomen is flat. Bowel sounds are normal. There is no distension.     Palpations: Abdomen is  soft. There is no mass.     Tenderness: There is abdominal tenderness. There is no guarding or rebound.  Musculoskeletal:     Cervical back: Normal range of motion and neck supple.  Skin:    General: Skin is warm and dry.     Coloration: Skin is not jaundiced.     Findings: No lesion or rash.  Neurological:     Mental Status: He is alert and oriented to person, place, and time.  Psychiatric:        Mood and Affect: Mood normal.        Behavior: Behavior normal.      CBC    Component Value Date/Time   WBC 7.5 06/29/2021 0947   RBC 4.99 06/29/2021 0947   HGB 15.2 06/29/2021 0947   HCT 43.9 06/29/2021 0947   PLT 248.0 06/29/2021 0947   MCV 87.9 06/29/2021 0947   MCH 29.7 11/04/2020 1137   MCHC 34.7 06/29/2021 0947   RDW 12.8 06/29/2021 0947   LYMPHSABS 3.1 06/29/2021 0947   MONOABS 0.5 06/29/2021 0947   EOSABS 0.2 06/29/2021 0947   BASOSABS 0.0 06/29/2021 0947    CMP     Component Value Date/Time   NA 138 06/29/2021 0947   K 3.7 06/29/2021 0947   CL 101 06/29/2021 0947   CO2 32 06/29/2021 0947   GLUCOSE 83 06/29/2021 0947   BUN 16 06/29/2021 0947   CREATININE 0.87 06/29/2021 0947   CALCIUM 9.2 06/29/2021 0947   PROT 7.5 06/29/2021 0947   ALBUMIN 4.4 06/29/2021 0947   AST 18 06/29/2021 0947   ALT 14 06/29/2021 0947   ALKPHOS 75 06/29/2021 0947   BILITOT 0.7 06/29/2021 0947   GFRNONAA >60 11/04/2020 1137    ASSESSMENT AND PLAN: 32 year old male with at least a 1 year history of chronic upper abdominal pain with associated decrease in appetite and nearly 20lbs of unintentional weight loss over the past year.  Accurate history is still difficult despite presence of a translator, but it appears he had significant improvement in his symptoms when he was taking pantoprazole, but he ran out and did not request a refill.  He does have associated symptoms of acid regurgitation and waterbrash, which suggests GERD as the underlying problem.   The unintentional weight loss is  certainly concerning, and I recommended an upper endoscopy to exclude serious etiologies such as malignancy or PUD.  I also agreed with testing for parasitic infections, as had previously been ordered (stool sample not submitted).  The patient declined an upper endoscopy and a stool test, but preferred to go back on the on the pantoprazole, and then reconsider those options if his symptoms do not improve.  This is reasonable.  We discussed how stress and anxiety can cause many GI symptoms and can lead to decreased appetite and weight loss and that he should consider talking with his PCP or behavioral health to help manage  and cope with his feelings regarding his prolonged separation from his family.  Epigastric pain with associated weight loss, suspect GERD/anxiety - Resume Protonix daily - EGD, stool testing recommended but declined - f/u 8 weeks; will reconsider further testing at that time if symptoms not improved with PPI  I spent a total of 45 minutes reviewing the patient's medical record, interviewing and examining the patient, discussing his diagnosis and management of her condition going forward, and documenting in the medical record   Problem List Items Addressed This Visit   None Visit Diagnoses     Gastroesophageal reflux disease, unspecified whether esophagitis present    -  Primary   Relevant Medications   pantoprazole (PROTONIX) 40 MG tablet   Abdominal pain, epigastric          Luba Matzen E. Tomasa Rand, MD [facility]   CC:  Allwardt, Crist Infante, PA-C

## 2022-06-02 NOTE — Patient Instructions (Signed)
_______________________________________________________  If your blood pressure at your visit was 140/90 or greater, please contact your primary care physician to follow up on this.  _______________________________________________________  If you are age 30 or older, your body mass index should be between 23-30. Your Body mass index is 19.78 kg/m. If this is out of the aforementioned range listed, please consider follow up with your Primary Care Provider.  If you are age 3 or younger, your body mass index should be between 19-25. Your Body mass index is 19.78 kg/m. If this is out of the aformentioned range listed, please consider follow up with your Primary Care Provider.   We have sent the following medications to your pharmacy for you to pick up at your convenience: Pantoprazole 40 mg daily.  ________________________________________________________  The [facility] providers would like to encourage you to use [facility] to communicate with providers for non-urgent requests or questions.  Due to long hold times on the telephone, sending your provider a message by [facility] may be a faster and more efficient way to get a response.  Please allow 48 business hours for a response.  Please remember that this is for non-urgent requests.   It was a pleasure to see you today!  Thank you for trusting me with your gastrointestinal care!    Scott E.Tomasa Rand, MD

## 2022-06-03 ENCOUNTER — Telehealth: Payer: Self-pay | Admitting: Pharmacy Technician

## 2022-06-03 ENCOUNTER — Other Ambulatory Visit (HOSPITAL_COMMUNITY): Payer: Self-pay

## 2022-06-03 NOTE — Telephone Encounter (Signed)
Patient Advocate Encounter  Received notification from [facility] that prior authorization for PANTOPRAZOLE 40MG  is required.   PA NOT SUBMITTED   This medication can be filled with [facility] pharmacy locations at a price of $5 for 30 days without being billed to insurance. Patient is also welcome to check with retail locations for discounted prices.

## 2022-06-08 ENCOUNTER — Other Ambulatory Visit (HOSPITAL_COMMUNITY): Payer: Self-pay

## 2022-06-08 ENCOUNTER — Other Ambulatory Visit: Payer: Self-pay

## 2022-06-08 MED ORDER — PANTOPRAZOLE SODIUM 40 MG PO TBEC
40.0000 mg | DELAYED_RELEASE_TABLET | Freq: Every day | ORAL | 3 refills | Status: AC
  Filled 2022-06-08: qty 30, 30d supply, fill #0

## 2022-06-08 NOTE — Telephone Encounter (Signed)
Contacted patient and informed him that his insurance would not cover pantoprazole but could be sent to a [facility] pharmacy and he would pay $5 for thirty days. Patient agreed. Pantoprazole 40 mg was sent to [facility].

## 2022-06-16 ENCOUNTER — Other Ambulatory Visit (HOSPITAL_COMMUNITY): Payer: Self-pay

## 2022-08-05 ENCOUNTER — Ambulatory Visit: Payer: BC Managed Care – PPO | Admitting: Internal Medicine

## 2022-08-30 ENCOUNTER — Ambulatory Visit: Payer: 59 | Admitting: Gastroenterology

## 2022-10-24 ENCOUNTER — Ambulatory Visit (HOSPITAL_COMMUNITY)
Admission: RE | Admit: 2022-10-24 | Discharge: 2022-10-24 | Disposition: A | Discharge status: Home / Self Care | Payer: Medicaid Other | Source: Ambulatory Visit | Attending: Family Medicine | Admitting: Family Medicine

## 2022-10-24 ENCOUNTER — Encounter (HOSPITAL_COMMUNITY): Payer: Self-pay

## 2022-10-24 VITALS — BP 125/82 | HR 70 | Temp 98.3°F | Resp 16

## 2022-10-24 DIAGNOSIS — K122 Cellulitis and abscess of mouth: Secondary | ICD-10-CM | POA: Diagnosis not present

## 2022-10-24 MED ORDER — AMOXICILLIN 500 MG PO CAPS: 500.0000 mg | ORAL_CAPSULE | Freq: Three times a day (TID) | ORAL | 0 refills | Status: AC

## 2022-10-24 NOTE — ED Provider Notes (Signed)
[facility]    CSN: [id number] Arrival date & time: 10/24/22  0849      History   Chief Complaint Chief Complaint  Patient presents with   Oral Swelling    Pashto speaker. Possible infection. - Entered by patient    HPI George Cannon is a 32 y.o. male.   Right patient is presenting with lower molar pain.  Patient does have a history of crown placement over that side.  Patient has some tenderness on that side and feels like is not getting any better.  Patient is wondering if we can replace his counts today but patient otherwise evidently did not do that here and that he will likely need to see a dentist.  Patient denies any fevers or chills.  No other concerns at this time.     History reviewed. No pertinent past medical history.  There are no problems to display for this patient.   History reviewed. No pertinent surgical history.     Home Medications    Prior to Admission medications   Medication Sig Start Date End Date Taking? Authorizing Provider  amoxicillin (AMOXIL) 500 MG capsule Take 1 capsule (500 mg total) by mouth 3 (three) times daily. 10/24/22  Yes Brenton Grills, MD  acetaminophen (TYLENOL) 500 MG tablet Take 500 mg by mouth every 6 (six) hours as needed.    [provider]  pantoprazole (PROTONIX) 40 MG tablet Take 1 tablet (40 mg total) by mouth daily. 06/08/22 10/06/22  Jenel Lucks, MD    Family History Family History  Problem Relation Age of Onset   Hypertension Father    Colon cancer Neg Hx    Esophageal cancer Neg Hx    Stomach cancer Neg Hx     Social History Social History   Tobacco Use   Smoking status: Never   Smokeless tobacco: Never   Tobacco comments:    Snuff  Vaping Use   Vaping status: Never Used  Substance Use Topics   Alcohol use: Not Currently    Comment: social   Drug use: Not Currently    Types: Marijuana     Allergies   Patient has no known allergies.   Review of Systems Review of  Systems   Physical Exam Triage Vital Signs ED Triage Vitals  Encounter Vitals Group     BP 10/24/22 0914 125/82     Systolic BP Percentile --      Diastolic BP Percentile --      Pulse Rate 10/24/22 0914 70     Resp 10/24/22 0914 16     Temp 10/24/22 0914 98.3 F (36.8 C)     Temp Source 10/24/22 0914 Oral     SpO2 10/24/22 0914 97 %     Weight --      Height --      Head Circumference --      Peak Flow --      Pain Score 10/24/22 0912 2     Pain Loc --      Pain Education --      Exclude from Growth Chart --    No data found.  Updated Vital Signs BP 125/82 (BP Location: Left Arm)   Pulse 70   Temp 98.3 F (36.8 C) (Oral)   Resp 16   SpO2 97%   Visual Acuity Right Eye Distance:   Left Eye Distance:   Bilateral Distance:    Right Eye Near:   Left Eye Near:  Bilateral Near:     Physical Exam Constitutional:      Appearance: Normal appearance.  Cardiovascular:     Rate and Rhythm: Normal rate and regular rhythm.     Pulses: Normal pulses.     Heart sounds: Normal heart sounds.  Neurological:     Mental Status: He is alert.      UC Treatments / Results  Labs (all labs ordered are listed, but only abnormal results are displayed) Labs Reviewed - No data to display  EKG   Radiology No results found.  Procedures Procedures (including critical care time)  Medications Ordered in UC Medications - No data to display  Initial Impression / Assessment and Plan / UC Course  I have reviewed the triage vital signs and the nursing notes.  Pertinent labs & imaging results that were available during my care of the patient were reviewed by me and considered in my medical decision making (see chart for details).     Patient likely dealing with some swelling/infection of the gums of the right lower teeth.  Patient does have noted poor dental hygiene, at this time we will do amoxicillin 3 pills a day for the next 7 days.  Patient was also given resources  regarding local secondary dental areas that he may present in order to get his crown replaced. Final Clinical Impressions(s) / UC Diagnoses   Final diagnoses:  Oral infection     Discharge Instructions      Please take your antibiotics  Please go see a dentist that is on the sheet that is given to you.  Please take Tylenol and ibuprofen for any pain that you may have.    ED Prescriptions     Medication Sig Dispense Auth. Provider   amoxicillin (AMOXIL) 500 MG capsule Take 1 capsule (500 mg total) by mouth 3 (three) times daily. 21 capsule Brenton Grills, MD      PDMP not reviewed this encounter.   Brenton Grills, MD 10/24/22 [id number]

## 2022-10-24 NOTE — Discharge Instructions (Addendum)
Please take your antibiotics  Please go see a dentist that is on the sheet that is given to you.  Please take Tylenol and ibuprofen for any pain that you may have.

## 2022-10-24 NOTE — ED Triage Notes (Signed)
Patient here today with c/o right lower dental pain and swelling X 1 week. Patient has a cap that he has been using from [place] but the cap has became worn and broken. He is having pain when he chews.

## 2023-05-12 IMAGING — CT CT HEAD W/O CM
4 series · 17 of 47 positions shown, 19 images · non-contrast
Comparison: None.

CLINICAL DATA: Fall, head trauma 3-4 days ago

EXAM:
CT HEAD WITHOUT CONTRAST
TECHNIQUE: Contiguous axial images were obtained from the base of the skull
through the vertex without intravenous contrast.

[Series 2: head wo · axial · 0.41mm/px · z∈[+3,+118]mm · 7 of 31 slices shown, 9 images]
[im 4/31  brain]
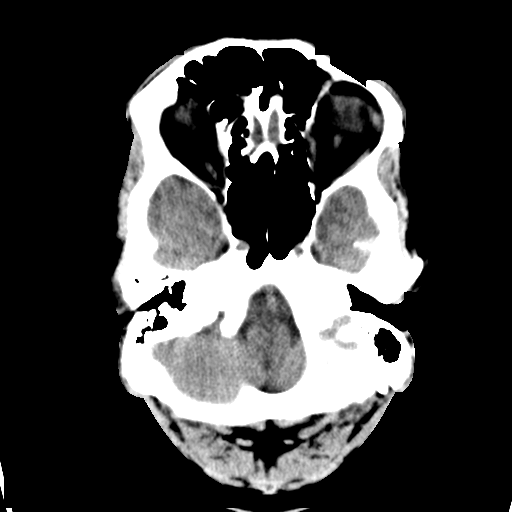
[im 4/31  bone]
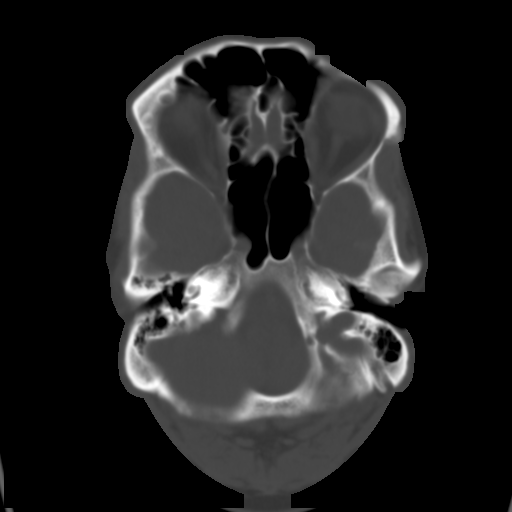
[im 8/31  brain]
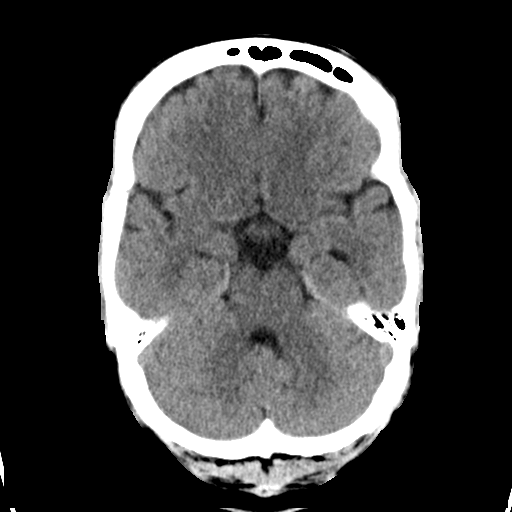
[im 12/31  brain]
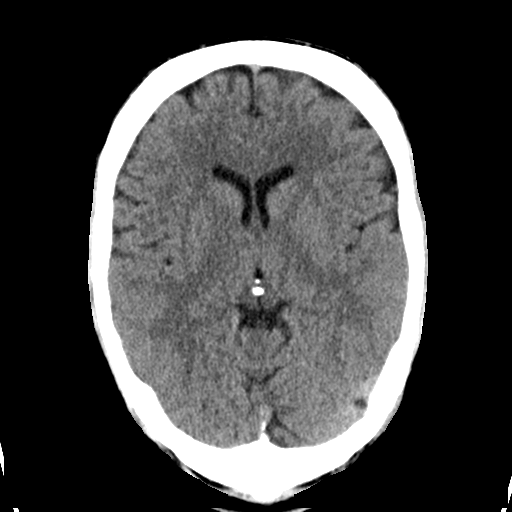
[im 16/31  brain]
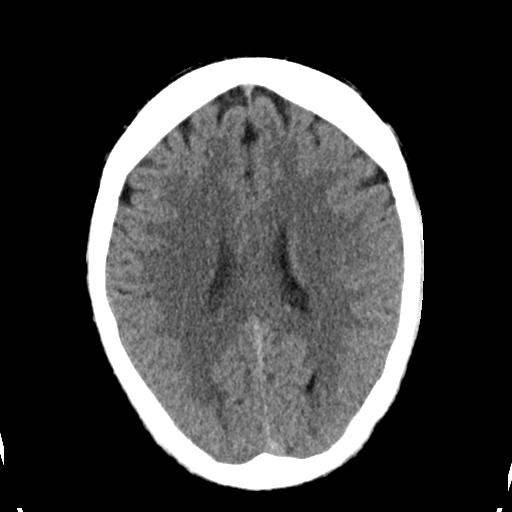
[im 19/31  brain]
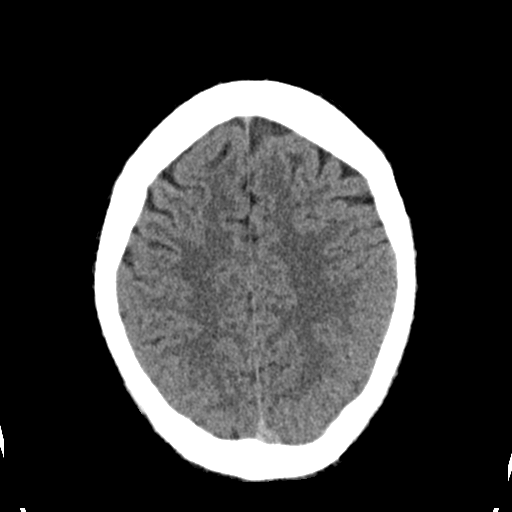
[im 19/31  bone]
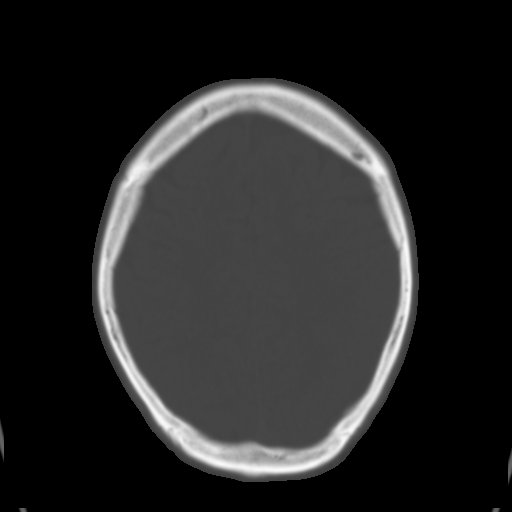
[im 23/31  brain]
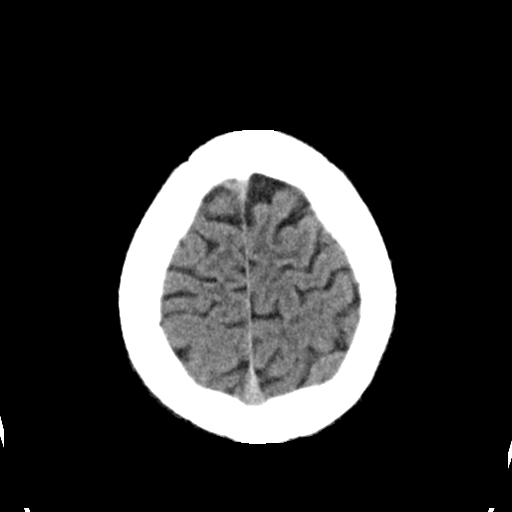
[im 27/31  brain]
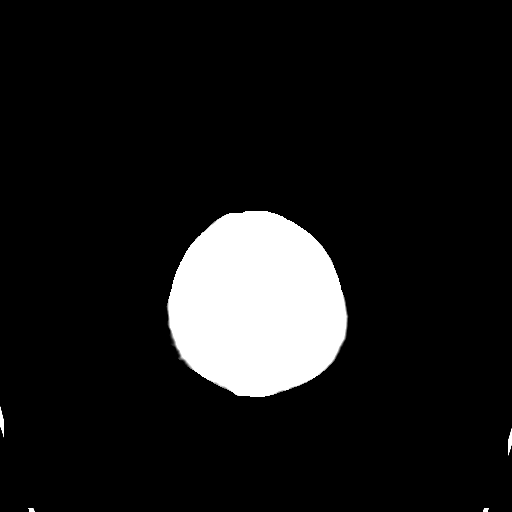

[Series 3: head bone · axial · 0.41mm/px · z∈[+2,+54]mm · 4 of 76 slices shown]
[im 8/76  bone]
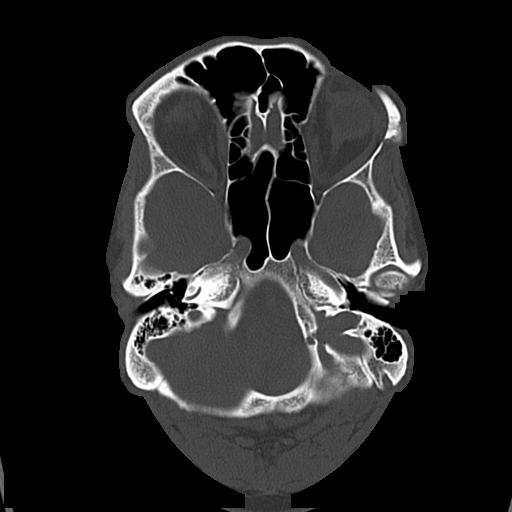
[im 16/76  bone]
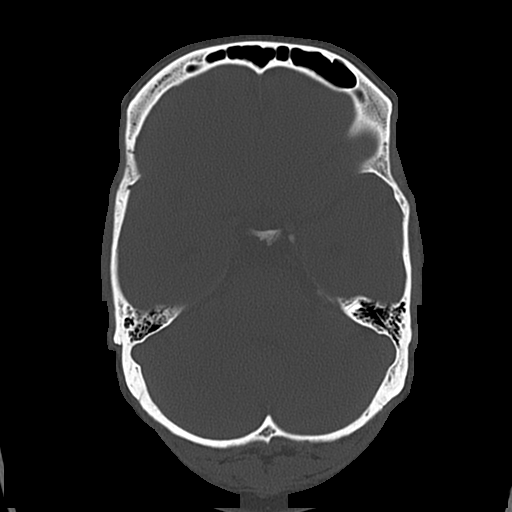
[im 23/76  bone]
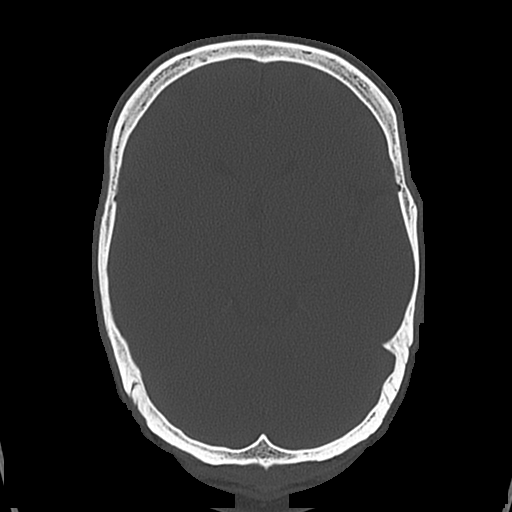
[im 34/76  bone]
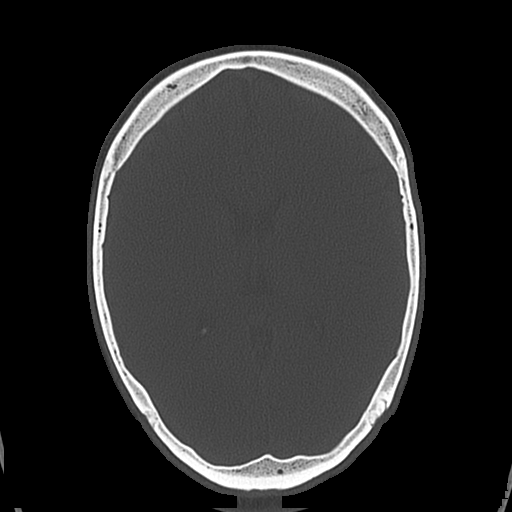

[Series 4: coronal soft · coronal · 0.31mm/px · 3 of 67 slices shown]
[im 23/67  brain]
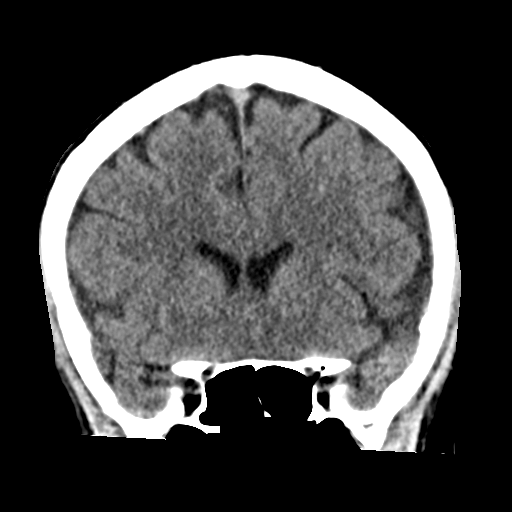
[im 30/67  brain]
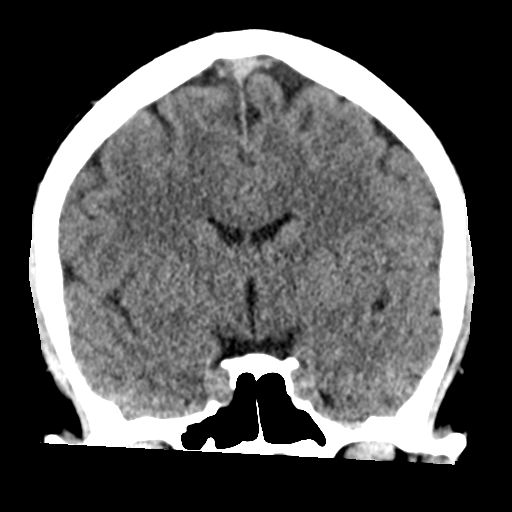
[im 37/67  brain]
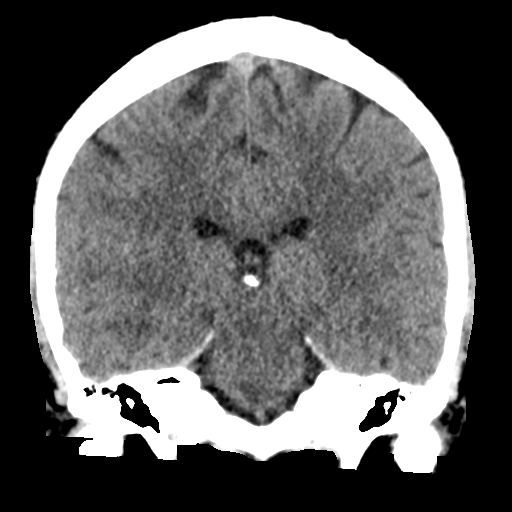

[Series 5: sagittal soft · sagittal · 0.31mm/px · 3 of 54 slices shown]
[im 18/54  brain]
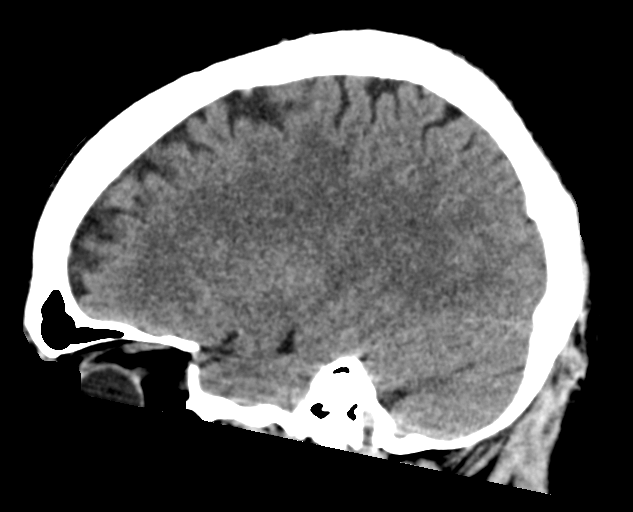
[im 27/54  brain]
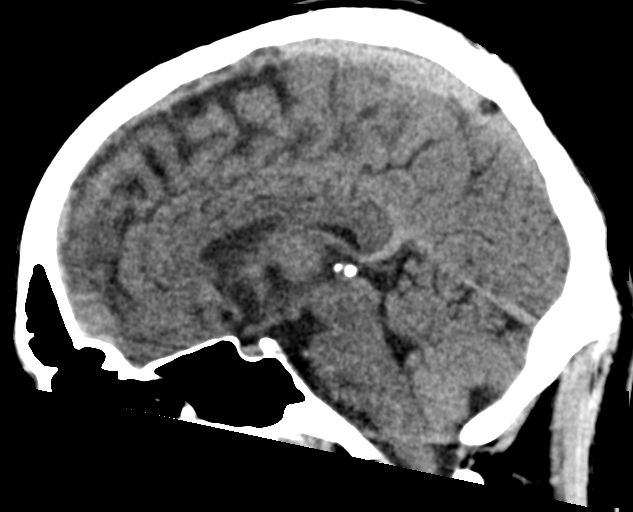
[im 36/54  brain]
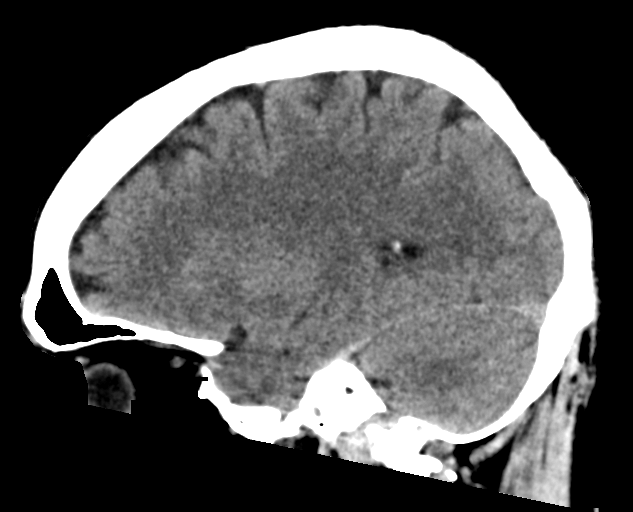

[17 of 47 positions shown; findings below may reference images not displayed]

FINDINGS: Brain: No evidence of acute infarction, hemorrhage, hydrocephalus,
extra-axial collection or mass lesion/mass effect.

Vascular: No hyperdense vessel or unexpected calcification.

Skull: Normal. Negative for fracture or focal lesion.

Sinuses/Orbits: No acute finding.

Other: None.
IMPRESSION: No acute intracranial abnormality
